# Patient Record
Sex: Female | Born: 1995 | Race: Black or African American | Hispanic: No | Marital: Single | Smoking: Current some day smoker
Health system: Southern US, Community
[De-identification: ages and names within clinical notes are randomized; demographics above are authoritative.]

## PROBLEM LIST (undated history)

## (undated) DIAGNOSIS — L309 Dermatitis, unspecified: Secondary | ICD-10-CM

## (undated) HISTORY — PX: VAGINA SURGERY: SHX829

## (undated) HISTORY — PX: WISDOM TOOTH EXTRACTION: SHX21

---
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
## 2017-07-11 ENCOUNTER — Emergency Department (HOSPITAL_COMMUNITY): Payer: Self-pay

## 2017-07-11 ENCOUNTER — Emergency Department (HOSPITAL_COMMUNITY)
Admission: EM | Admit: 2017-07-11 | Discharge: 2017-07-11 | Disposition: A | Discharge status: Home / Self Care | Payer: Self-pay | Attending: Emergency Medicine | Admitting: Emergency Medicine

## 2017-07-11 ENCOUNTER — Other Ambulatory Visit: Payer: Self-pay

## 2017-07-11 ENCOUNTER — Encounter (HOSPITAL_COMMUNITY): Payer: Self-pay | Admitting: Emergency Medicine

## 2017-07-11 DIAGNOSIS — Y999 Unspecified external cause status: Secondary | ICD-10-CM | POA: Insufficient documentation

## 2017-07-11 DIAGNOSIS — Z79899 Other long term (current) drug therapy: Secondary | ICD-10-CM | POA: Insufficient documentation

## 2017-07-11 DIAGNOSIS — Y9389 Activity, other specified: Secondary | ICD-10-CM | POA: Insufficient documentation

## 2017-07-11 DIAGNOSIS — S46911A Strain of unspecified muscle, fascia and tendon at shoulder and upper arm level, right arm, initial encounter: Secondary | ICD-10-CM | POA: Insufficient documentation

## 2017-07-11 DIAGNOSIS — F17200 Nicotine dependence, unspecified, uncomplicated: Secondary | ICD-10-CM | POA: Insufficient documentation

## 2017-07-11 DIAGNOSIS — Y929 Unspecified place or not applicable: Secondary | ICD-10-CM | POA: Insufficient documentation

## 2017-07-11 MED ORDER — METHYLPREDNISOLONE 4 MG PO TBPK: ORAL_TABLET | ORAL | 0 refills | Status: DC
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
## 2017-07-11 NOTE — Discharge Instructions (Signed)
Please read attached information regarding shoulder range of motion exercises. Take steroids and taper Dosepak as directed. Follow-up with orthopedist listed below for further evaluation and further imaging. Take Tylenol as needed for pain. Return to ED for worsening symptoms, additional injuries, numbness in arm, red hot/tender joint with fever.

## 2017-09-17 ENCOUNTER — Ambulatory Visit (HOSPITAL_COMMUNITY)
Admission: EM | Admit: 2017-09-17 | Discharge: 2017-09-17 | Disposition: A | Discharge status: Home / Self Care | Payer: Medicaid Other | Attending: Physician Assistant | Admitting: Physician Assistant

## 2017-09-17 ENCOUNTER — Encounter (HOSPITAL_COMMUNITY): Payer: Self-pay | Admitting: Emergency Medicine

## 2017-09-17 DIAGNOSIS — J00 Acute nasopharyngitis [common cold]: Secondary | ICD-10-CM

## 2017-09-17 MED ORDER — CETIRIZINE-PSEUDOEPHEDRINE ER 5-120 MG PO TB12: 1.0000 | ORAL_TABLET | Freq: Two times a day (BID) | ORAL | 0 refills | Status: AC

## 2017-09-17 MED ORDER — CETIRIZINE-PSEUDOEPHEDRINE ER 5-120 MG PO TB12: 1.0000 | ORAL_TABLET | Freq: Two times a day (BID) | ORAL | 0 refills | Status: DC

## 2017-09-17 MED ORDER — NAPROXEN 500 MG PO TABS: 500.0000 mg | ORAL_TABLET | Freq: Two times a day (BID) | ORAL | 0 refills | Status: DC

## 2017-09-17 NOTE — Discharge Instructions (Signed)
His symptoms are most likely allergies with a common cold.  We will try some Zyrtec-D and a pain reliever.  If not getting better in the next 3 days and please come back in.

## 2017-09-17 NOTE — ED Triage Notes (Addendum)
Started feeling bad one week ago.  Patient reports current menstrual is very light compared to usual, this is the normal time for her period.  Having back and abdominal pain   Both ears are hurting

## 2017-09-17 NOTE — ED Provider Notes (Signed)
09/17/2017 6:58 PM   DOB: 01/25/1996 / MRN: [id number]  SUBJECTIVE:  Kelli Hutchinson is a 22 y.o. female presenting for ear pressure, sinus pressure, mild rhinorrhea, itchy throat.  The symptoms started about 3-4 days ago.  She is here mostly because of her ear pressure states they feel full.  She denies fever, cough, dizziness.  As per HPI  She is allergic to pollen extract and bee pollen.   She  has no past medical history on file.    She  reports that she has been smoking.  she has never used smokeless tobacco. She reports that she uses drugs. Drug: Marijuana. She reports that she does not drink alcohol. She  reports that she currently engages in sexual activity. She reports using the following method of birth control/protection: Implant. The patient  has a past surgical history that includes Wisdom tooth extraction and Vagina surgery.  Her family history includes Hypertension in her mother.  ROS   As per HPI otherwise negative  OBJECTIVE:  BP 99/67 (BP Location: Left Arm)   Pulse 81   Temp 98.8 F (37.1 C) (Oral)   Resp 18   Wt 132 lb 6 oz (60 kg)   SpO2 100%   BMI 23.45 kg/m   Physical Exam  Constitutional: She is active.  Non-toxic appearance.  HENT:  Right Ear: Hearing, tympanic membrane, external ear and ear canal normal.  Left Ear: Hearing, tympanic membrane, external ear and ear canal normal.  Nose: Nose normal. Right sinus exhibits no maxillary sinus tenderness and no frontal sinus tenderness. Left sinus exhibits no maxillary sinus tenderness and no frontal sinus tenderness.  Mouth/Throat: Uvula is midline, oropharynx is clear and moist and mucous membranes are normal. Mucous membranes are not dry. No oropharyngeal exudate, posterior oropharyngeal edema or tonsillar abscesses.  Cardiovascular: Normal rate.  Pulmonary/Chest: Effort normal. No tachypnea.  Lymphadenopathy:       Head (right side): No submandibular and no tonsillar adenopathy present.       Head (left  side): No submandibular and no tonsillar adenopathy present.    She has no cervical adenopathy.  Neurological: She is alert.  Skin: Skin is warm and dry. She is not diaphoretic. No pallor.    No results found for this or any previous visit (from the past 72 hour(s)).  No results found.  ASSESSMENT AND PLAN:  No orders of the defined types were placed in this encounter.    Common cold patient with perfect exam.  Vitals excellent.  Symptoms are very mild.  We will treat symptomatic.  Connye Burkittlly advised this is going to take mostly time to make a full recovery.      The patient is advised to call or return to clinic if she does not see an improvement in symptoms, or to seek the care of the closest emergency department if she worsens with the above plan.   Deliah BostonMichael Roque Schill, MHS, PA-C 09/17/2017 6:58 PM    Ofilia Neaslark, Luz Burcher L, PA-C 09/17/17 1859

## 2018-04-10 ENCOUNTER — Emergency Department (HOSPITAL_COMMUNITY): Payer: Medicaid Other

## 2018-04-10 ENCOUNTER — Encounter (HOSPITAL_COMMUNITY): Payer: Self-pay | Admitting: Emergency Medicine

## 2018-04-10 ENCOUNTER — Emergency Department (HOSPITAL_COMMUNITY)
Admission: EM | Admit: 2018-04-10 | Discharge: 2018-04-10 | Disposition: A | Discharge status: Home / Self Care | Payer: Medicaid Other | Attending: Emergency Medicine | Admitting: Emergency Medicine

## 2018-04-10 DIAGNOSIS — F17210 Nicotine dependence, cigarettes, uncomplicated: Secondary | ICD-10-CM | POA: Insufficient documentation

## 2018-04-10 DIAGNOSIS — M25531 Pain in right wrist: Secondary | ICD-10-CM

## 2018-04-10 DIAGNOSIS — Z79899 Other long term (current) drug therapy: Secondary | ICD-10-CM | POA: Insufficient documentation

## 2018-04-10 MED ORDER — IBUPROFEN 400 MG PO TABS
600.0000 mg | ORAL_TABLET | Freq: Once | ORAL | Status: AC
  Administered 2018-04-10: 12:00:00 600 mg via ORAL
  Filled 2018-04-10: qty 1

## 2018-04-10 NOTE — Discharge Instructions (Signed)
Your x-ray shows no evidence of fracture, you likely have a wrist sprain or mild soft tissue injury.  Please use wrist brace for support.  Ice and elevate the wrist.  Tylenol and Motrin as needed for pain every 6 hours.  If pain is still not improving please follow-up with your primary care doctor.  Return for significantly worsened wrist pain or swelling, fevers, numbness or weakness over the wrist that persists or any other new or concerning symptoms.

## 2018-04-10 NOTE — ED Notes (Signed)
Declined W/C at D/C and was escorted to lobby by RN. 

## 2018-04-10 NOTE — ED Triage Notes (Signed)
Patient to ED c/o R wrist pain and swelling after throwing the football around yesterday. Reports painful to move. No obvious injury noted.

## 2018-04-10 NOTE — ED Provider Notes (Signed)
[facility] EMERGENCY DEPARTMENT Provider Note   CSN: [id number] Arrival date & time: 04/10/18  1101     History   Chief Complaint Chief Complaint  Patient presents with  . Wrist Pain    HPI Kelli Hutchinson is a 22 y.o. female.  Kelli Hutchinson is a 22 y.o. Female was healthy, presents to the ED for evaluation of right wrist pain.  Patient reports yesterday she was playing football with family members, went to catch the football but it hit her in the ulnar aspect of the wrist and she felt a slight pop.  Since then she has had pain and swelling over the ulnar aspect of the wrist that radiates up into the hand.  She took some Tylenol last night and this morning with minimal improvement, has not tried anything else to treat pain, but reports pain is constant and worsening.  She denies any numbness or weakness had some tingling initially after the injury that has resolved.  She has full range of motion of all fingers and wrist with some discomfort.  No prior injury or surgery to the wrist.  No lacerations or ecchymosis.     History reviewed. No pertinent past medical history.  There are no active problems to display for this patient.   Past Surgical History:  Procedure Laterality Date  . VAGINA SURGERY    . WISDOM TOOTH EXTRACTION       OB History   None      Home Medications    Prior to Admission medications   Medication Sig Start Date End Date Taking? Authorizing Provider  naproxen (NAPROSYN) 500 MG tablet Take 1 tablet (500 mg total) by mouth 2 (two) times daily. 09/17/17   Ofilia Neas, PA-C    Family History Family History  Problem Relation Age of Onset  . Hypertension Mother     Social History Social History   Tobacco Use  . Smoking status: Current Some Day Smoker  . Smokeless tobacco: Never Used  Substance Use Topics  . Alcohol use: No  . Drug use: Yes    Types: Marijuana     Allergies   Pollen extract and Bee  pollen   Review of Systems Review of Systems  Constitutional: Negative for chills and fever.  Musculoskeletal: Positive for arthralgias and joint swelling.  Skin: Negative for color change, rash and wound.  Neurological: Negative for weakness and numbness.     Physical Exam Updated Vital Signs BP 99/73 (BP Location: Left Arm)   Pulse 90   Temp 99 F (37.2 C) (Oral)   Resp 18   LMP 04/10/2018 (Exact Date)   SpO2 100%   Physical Exam  Constitutional: She appears well-developed and well-nourished. No distress.  HENT:  Head: Normocephalic and atraumatic.  Eyes: Right eye exhibits no discharge. Left eye exhibits no discharge.  Pulmonary/Chest: Effort normal. No respiratory distress.  Musculoskeletal:  Tenderness to palpation over the ulnar aspect of the right wrist with a small amount of swelling noted, no erythema or warmth and no obvious palpable deformity.  Range of motion of the wrist is intact with some discomfort, normal range of motion of all fingers, cardinal hand movements intact, 5/5 grip strength.  2+ radial and ulnar pulse and good capillary refill, sensation intact throughout.  All compartments soft.  Neurological: She is alert. Coordination normal.  Skin: Skin is warm and dry. She is not diaphoretic.  Psychiatric: She has a normal mood and affect. Her behavior is  normal.  Nursing note and vitals reviewed.    ED Treatments / Results  Labs (all labs ordered are listed, but only abnormal results are displayed) Labs Reviewed - No data to display  EKG None  Radiology Dg Wrist Complete Right  Result Date: 04/10/2018 CLINICAL DATA:  Pt said she was catching a football yesterday when it hit her wrist and "shifted" her bone. Pain and swelling to ulnar side of right wrist. No surgery to wrist. EXAM: RIGHT WRIST - COMPLETE 3+ VIEW COMPARISON:  None. FINDINGS: There is no evidence of fracture or dislocation. There is no evidence of arthropathy or other focal bone  abnormality. Soft tissues are unremarkable. IMPRESSION: Negative. Electronically Signed   By: Amie Portland M.D.   On: 04/10/2018 12:13    Procedures Procedures (including critical care time)  Medications Ordered in ED Medications  ibuprofen (ADVIL,MOTRIN) tablet 600 mg (600 mg Oral Given 04/10/18 1229)     Initial Impression / Assessment and Plan / ED Course  I have reviewed the triage vital signs and the nursing notes.  Pertinent labs & imaging results that were available during my care of the patient were reviewed by me and considered in my medical decision making (see chart for details).  Patient presents for evaluation of right wrist pain after she was hit in the wrist with a football while playing with family members.  Small amount of swelling but no palpable deformity on exam.  Hand is neurovascularly intact with range of motion intact.  X-ray shows no evidence of fracture dislocation.  Suspect slight sprain or soft tissue injury.  Patient placed in Velcro wrist splint.  Encouraged Tylenol and NSAIDs, ice and elevation.  Patient to follow-up with primary care doctor if symptoms are not improving.  Return precautions discussed.  Patient expresses understanding and agreement with plan.  Stable for discharge at this time.  Final Clinical Impressions(s) / ED Diagnoses   Final diagnoses:  Right wrist pain    ED Discharge Orders    None       Legrand Rams 04/10/18 1251    Loren Racer, MD 04/11/18 [phone]

## 2018-09-05 ENCOUNTER — Ambulatory Visit (HOSPITAL_COMMUNITY)
Admission: EM | Admit: 2018-09-05 | Discharge: 2018-09-05 | Disposition: A | Discharge status: Home / Self Care | Payer: Self-pay | Attending: Family Medicine | Admitting: Family Medicine

## 2018-09-05 ENCOUNTER — Encounter (HOSPITAL_COMMUNITY): Payer: Self-pay | Admitting: Emergency Medicine

## 2018-09-05 ENCOUNTER — Ambulatory Visit (INDEPENDENT_AMBULATORY_CARE_PROVIDER_SITE_OTHER): Payer: Self-pay

## 2018-09-05 DIAGNOSIS — M79644 Pain in right finger(s): Secondary | ICD-10-CM

## 2018-09-05 DIAGNOSIS — R1110 Vomiting, unspecified: Secondary | ICD-10-CM

## 2018-09-05 NOTE — ED Triage Notes (Signed)
Pt states she threw up yesterday a whole lot, denies throwing up today, states her job required her to come get checked out. Pt states they wont let her come back to work without a note.

## 2018-09-05 NOTE — Discharge Instructions (Signed)
Thumb will take 3-6 weeks to recover  Vomiting seems to be better.  Return as needed

## 2018-09-05 NOTE — ED Triage Notes (Signed)
Pt also c/o thumb pain in her R hand states two weeks ago she bent her thumb backward.

## 2018-09-05 NOTE — ED Provider Notes (Signed)
[facility]    CSN: [id number] Arrival date & time: 09/05/18  1220     History   Chief Complaint Chief Complaint  Patient presents with  . Emesis  . Letter for School/Work  . Thumb Pain    HPI Kelli Hutchinson is a 23 y.o. female.   HPI  Patient is here for 2 problems.  She states yesterday she woke up with some nausea.  She then proceeded to vomit several times.  Was in bed through most of the morning early afternoon.  She states that she also started her period.  She was having cramps.  Sometimes she does have a bed.  With nausea and vomiting as well as the cramping.  She states she took something for the nausea, then took some Aleve, when she "woke up this morning she felt well. She needs a note for work for missing yesterday. She also has right thumb pain.  She fell about 2 weeks ago.  Her thumb was quite swollen and painful.  She thought it would improve on its own.  It is mostly better, but still has swelling and pain around the DIP joint.  She states she cannot pinch with any force without feeling pain in her thumb.  History reviewed. No pertinent past medical history.  There are no active problems to display for this patient.   Past Surgical History:  Procedure Laterality Date  . VAGINA SURGERY    . WISDOM TOOTH EXTRACTION      OB History   No obstetric history on file.      Home Medications    Prior to Admission medications   Not on File    Family History Family History  Problem Relation Age of Onset  . Hypertension Mother     Social History Social History   Tobacco Use  . Smoking status: Current Some Day Smoker  . Smokeless tobacco: Never Used  Substance Use Topics  . Alcohol use: No  . Drug use: Yes    Types: Marijuana     Allergies   Pollen extract and Bee pollen   Review of Systems Review of Systems  Constitutional: Negative for chills and fever.  HENT: Negative for ear pain and sore throat.   Eyes: Negative for pain  and visual disturbance.  Respiratory: Negative for cough and shortness of breath.   Cardiovascular: Negative for chest pain and palpitations.  Gastrointestinal: Negative.  Negative for abdominal pain, nausea and vomiting.       Vomiting is resolved  Genitourinary: Positive for menstrual problem. Negative for dysuria and hematuria.  Musculoskeletal: Positive for arthralgias. Negative for back pain.  Skin: Negative for color change and rash.  Neurological: Negative for seizures and syncope.  All other systems reviewed and are negative.    Physical Exam Triage Vital Signs ED Triage Vitals  Enc Vitals Group     BP 09/05/18 1245 111/62     Pulse Rate 09/05/18 1245 (!) 58     Resp 09/05/18 1245 14     Temp 09/05/18 1245 98 F (36.7 C)     Temp src --      SpO2 09/05/18 1245 100 %     Weight --      Height --      Head Circumference --      Peak Flow --      Pain Score 09/05/18 1247 0     Pain Loc --      Pain Edu? --  Excl. in GC? --    No data found.  Updated Vital Signs BP 111/62   Pulse (!) 58   Temp 98 F (36.7 C)   Resp 14   LMP 09/05/2018   SpO2 100%      Physical Exam Constitutional:      General: She is not in acute distress.    Appearance: She is well-developed.  HENT:     Head: Normocephalic and atraumatic.     Nose: Nose normal.     Mouth/Throat:     Mouth: Mucous membranes are moist.  Eyes:     Conjunctiva/sclera: Conjunctivae normal.     Pupils: Pupils are equal, round, and reactive to light.  Neck:     Musculoskeletal: Normal range of motion.  Cardiovascular:     Rate and Rhythm: Normal rate and regular rhythm.     Heart sounds: Normal heart sounds.  Pulmonary:     Effort: Pulmonary effort is normal. No respiratory distress.     Breath sounds: Normal breath sounds.  Abdominal:     General: There is no distension.     Palpations: Abdomen is soft.     Tenderness: There is no abdominal tenderness.  Musculoskeletal: Normal range of motion.       Comments: The right thumb has tenderness around the DIP joint.  Full range of motion.  No instability.  It is slightly swollen as opposed to the left  Skin:    General: Skin is warm and dry.  Neurological:     General: No focal deficit present.     Mental Status: She is alert. Mental status is at baseline.  Psychiatric:        Mood and Affect: Mood normal.      UC Treatments / Results  Labs (all labs ordered are listed, but only abnormal results are displayed) Labs Reviewed - No data to display  EKG None  Radiology Dg Finger Thumb Right  Result Date: 09/05/2018 CLINICAL DATA:  Right thumb pain after fall 2 weeks ago. EXAM: RIGHT THUMB 2+V COMPARISON:  Radiographs of April 10, 2018. FINDINGS: There is no evidence of fracture or dislocation. There is no evidence of arthropathy or other focal bone abnormality. Soft tissues are unremarkable IMPRESSION: Negative. Electronically Signed   By: Lupita Raider, M.D.   On: 09/05/2018 13:26    Procedures Procedures (including critical care time)  Medications Ordered in UC Medications - No data to display  Initial Impression / Assessment and Plan / UC Course  I have reviewed the triage vital signs and the nursing notes.  Pertinent labs & imaging results that were available during my care of the patient were reviewed by me and considered in my medical decision making (see chart for details).      Final Clinical Impressions(s) / UC Diagnoses   Final diagnoses:  Non-intractable vomiting, presence of nausea not specified, unspecified vomiting type  Thumb pain, right     Discharge Instructions     Thumb will take 3-6 weeks to recover  Vomiting seems to be better.  Return as needed   ED Prescriptions    None     Controlled Substance Prescriptions [place] Controlled Substance Registry consulted? Not Applicable   Eustace Moore, MD 09/05/18 1348

## 2019-04-19 IMAGING — CR DG CHEST 2V
2 series · 2 of 2 positions shown · non-contrast
Comparison: 08/12/2016.

CLINICAL DATA: Right chest pain.  Smoker.

EXAM:
CHEST  2 VIEW

[w chest pa]
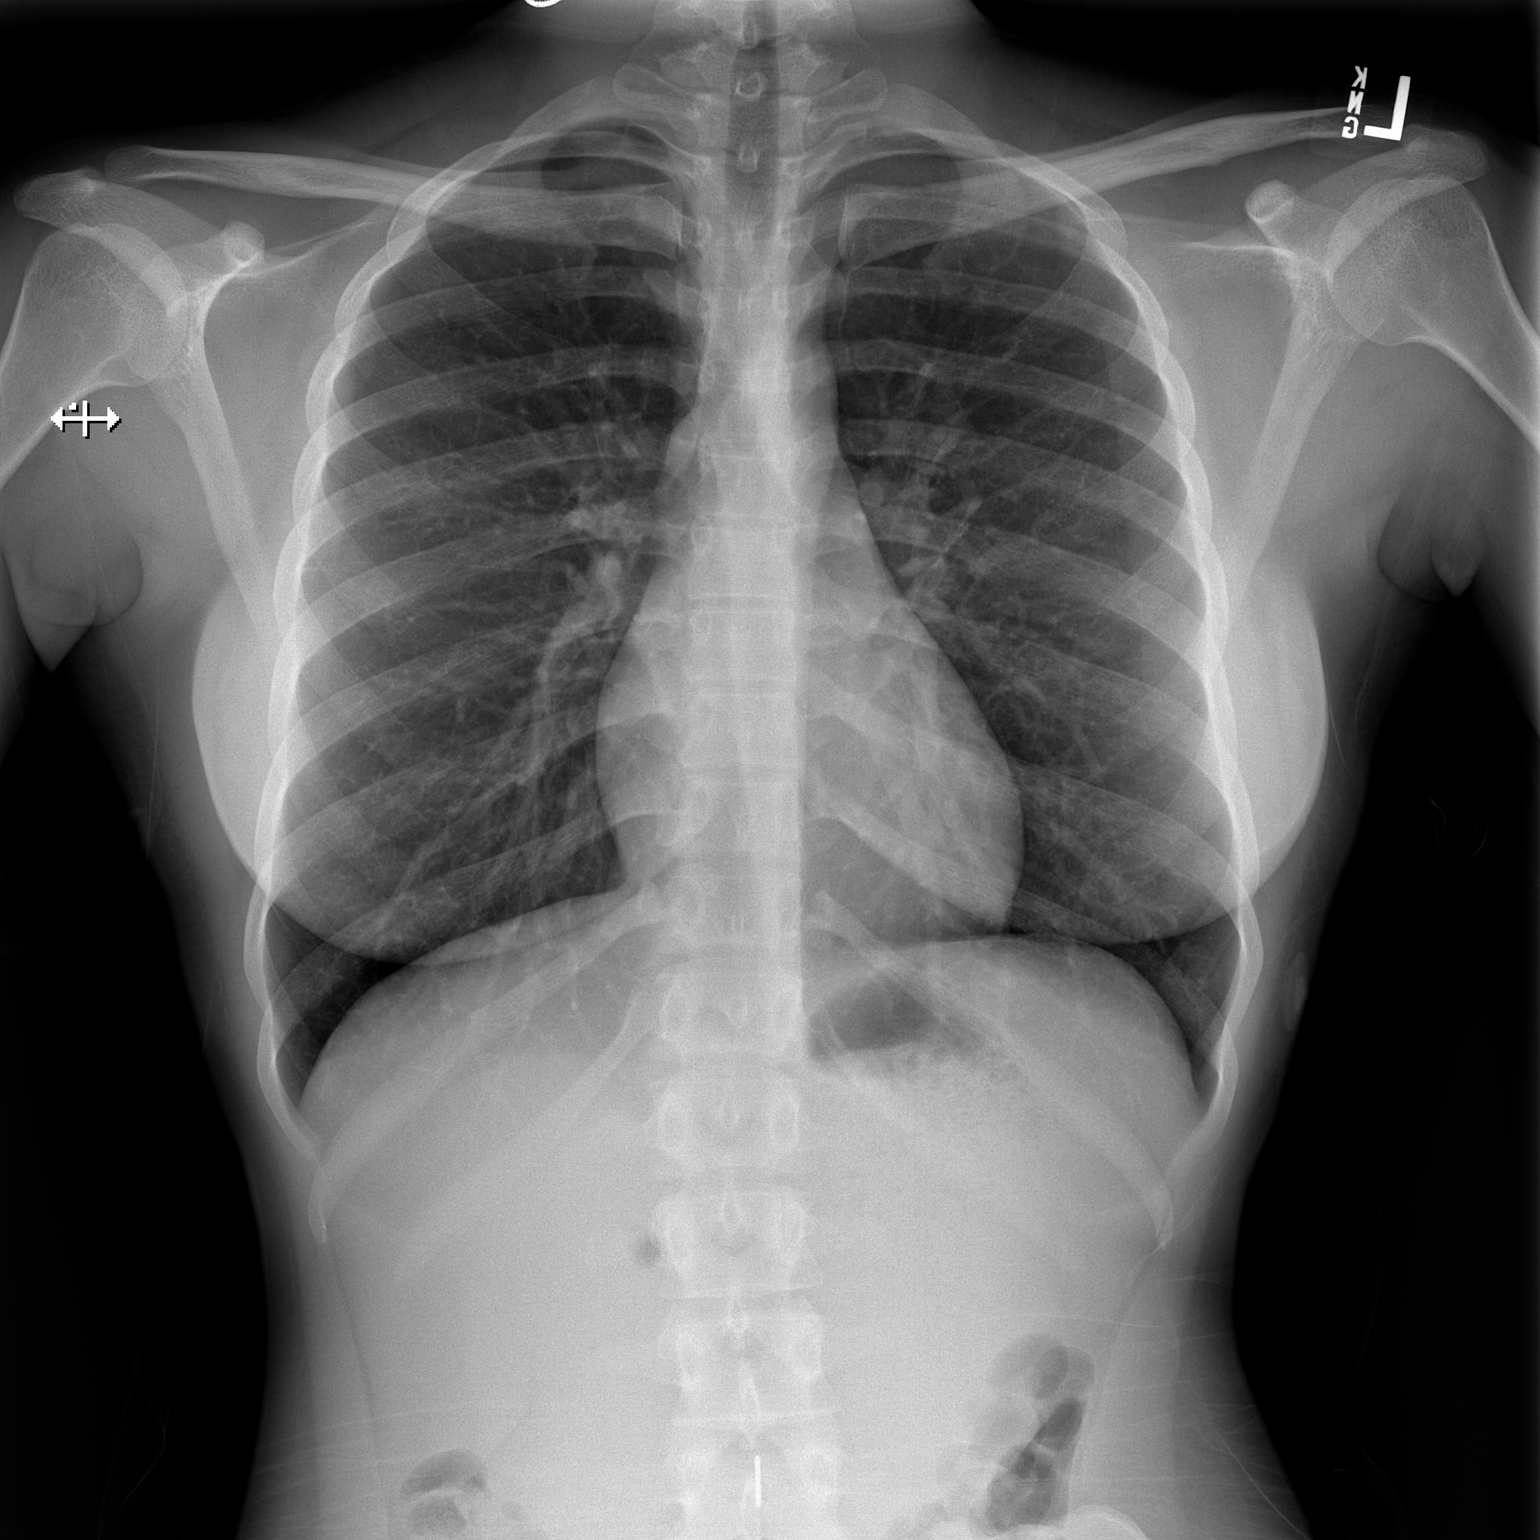

[w chest lat]
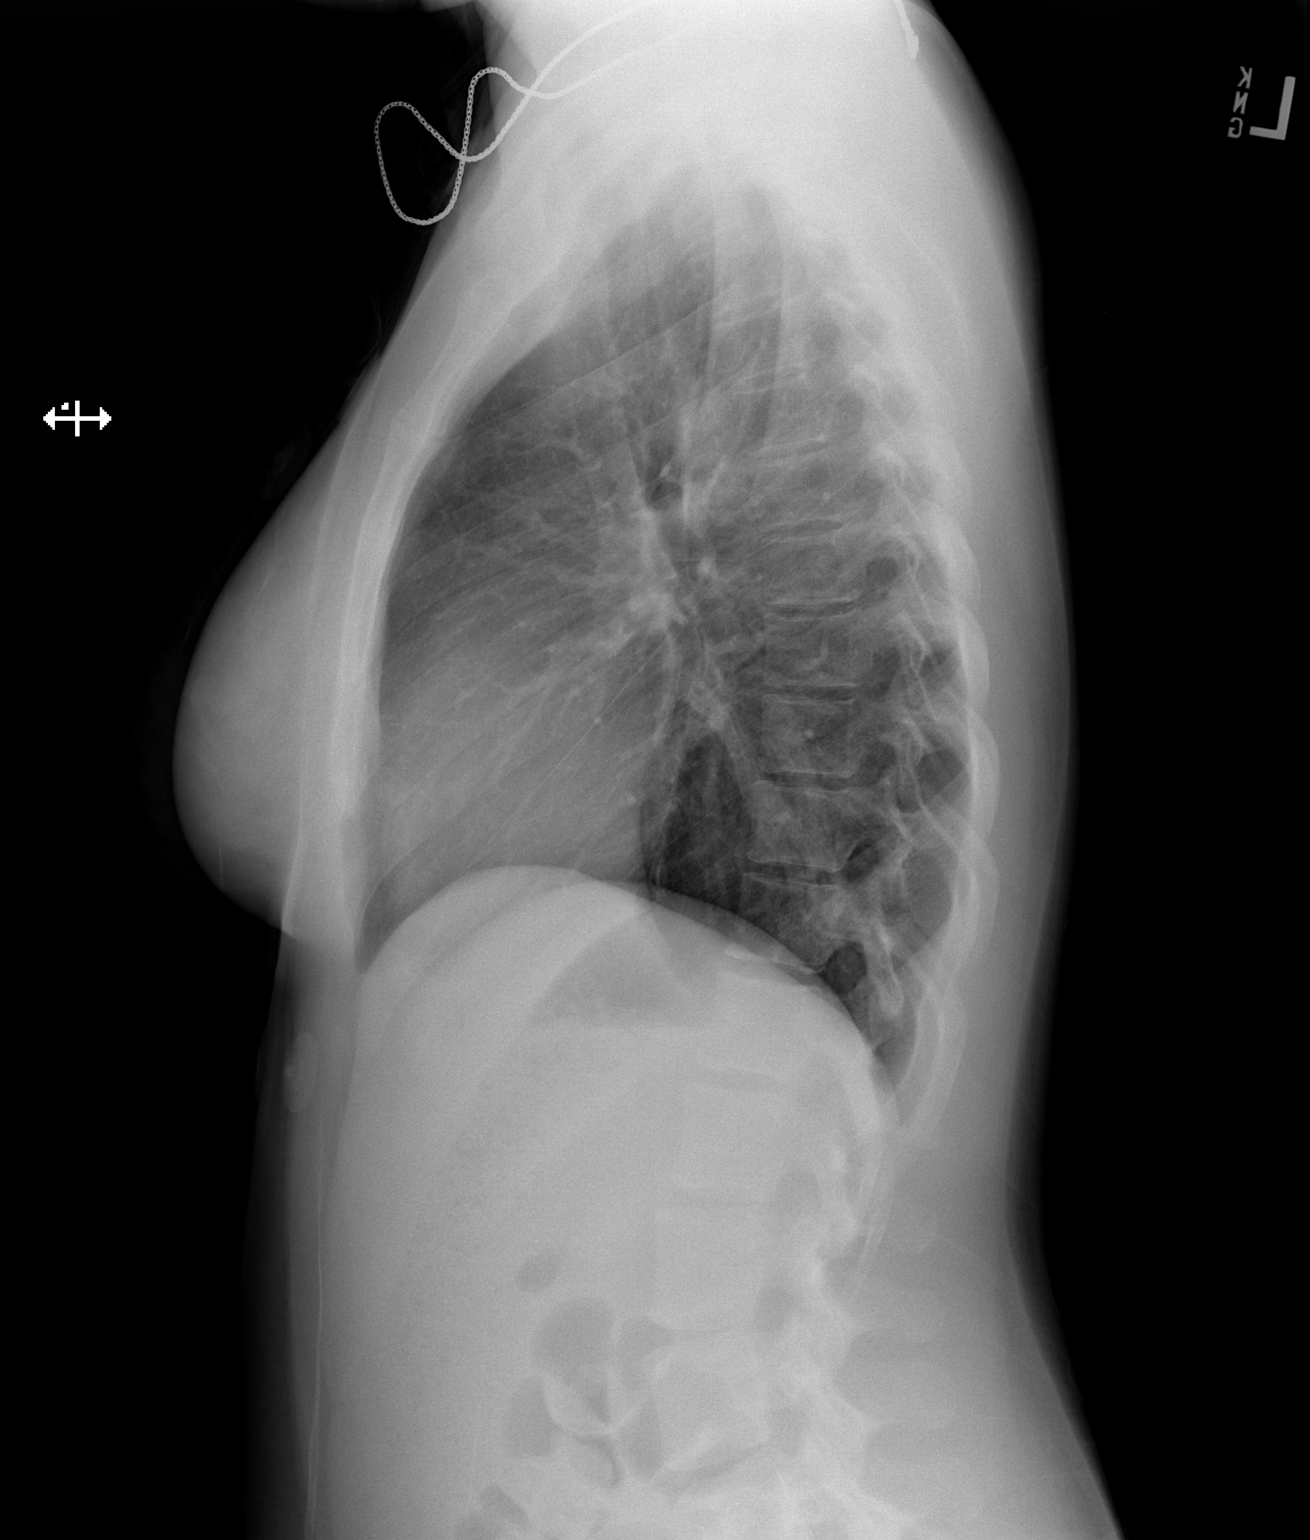

[2 of 2 positions shown; findings below may reference images not displayed]

FINDINGS: The heart size and mediastinal contours are within normal limits.
Both lungs are clear. The visualized skeletal structures are
unremarkable.
IMPRESSION: Normal examination.

## 2020-09-01 IMAGING — CR DG WRIST COMPLETE 3+V*R*
4 series · 4 of 4 positions shown · non-contrast
Comparison: None.

CLINICAL DATA: Pt said she was catching a football yesterday when
it hit her wrist and "shifted" her bone. Pain and swelling to ulnar
side of right wrist. No surgery to wrist.

EXAM:
RIGHT WRIST - COMPLETE 3+ VIEW

[wrist pa]
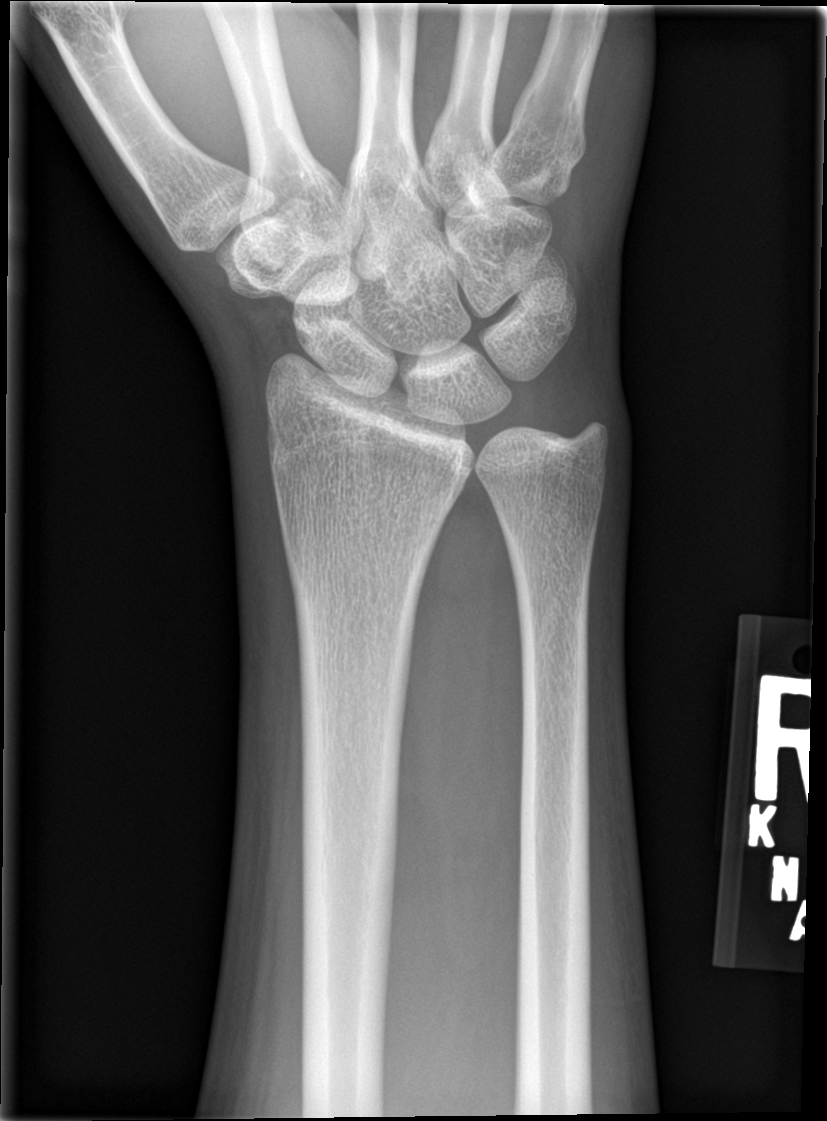

[wrist obl]
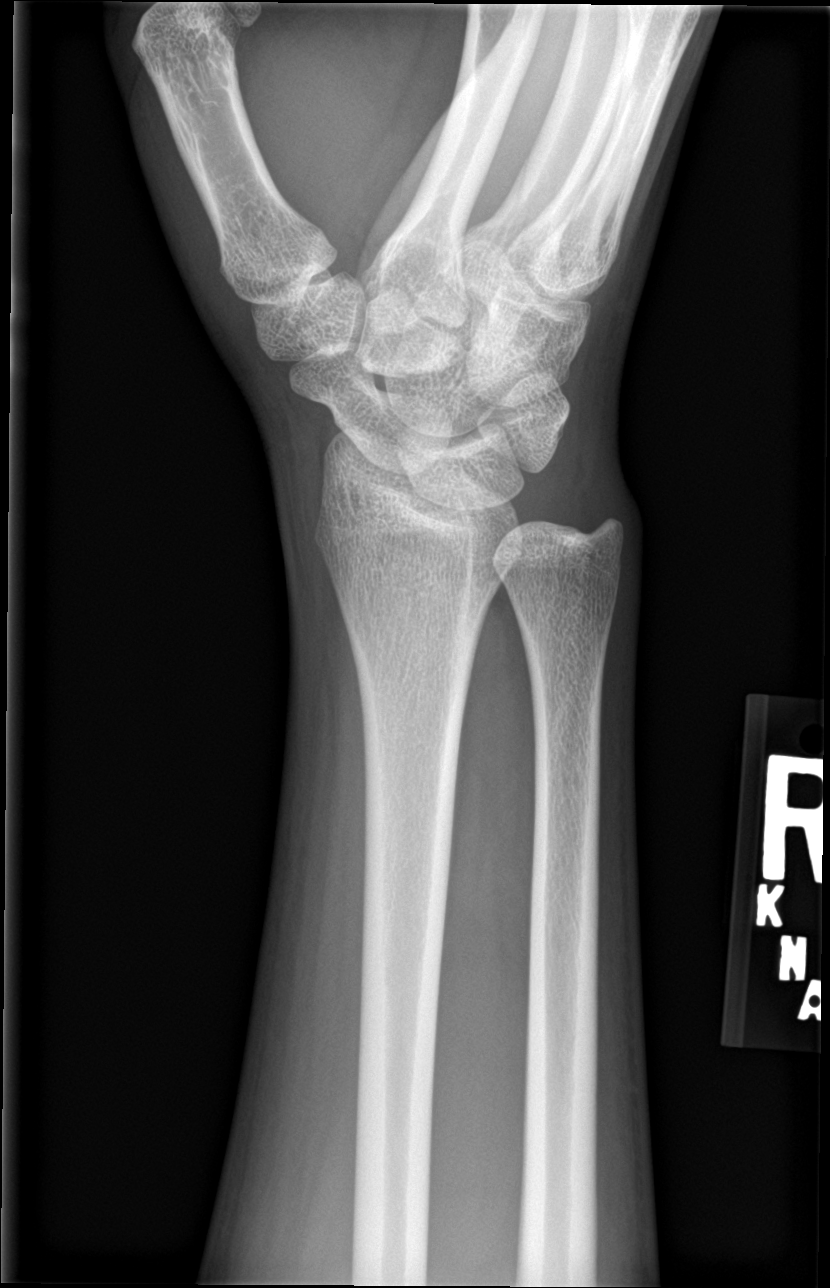

[wrist lat]
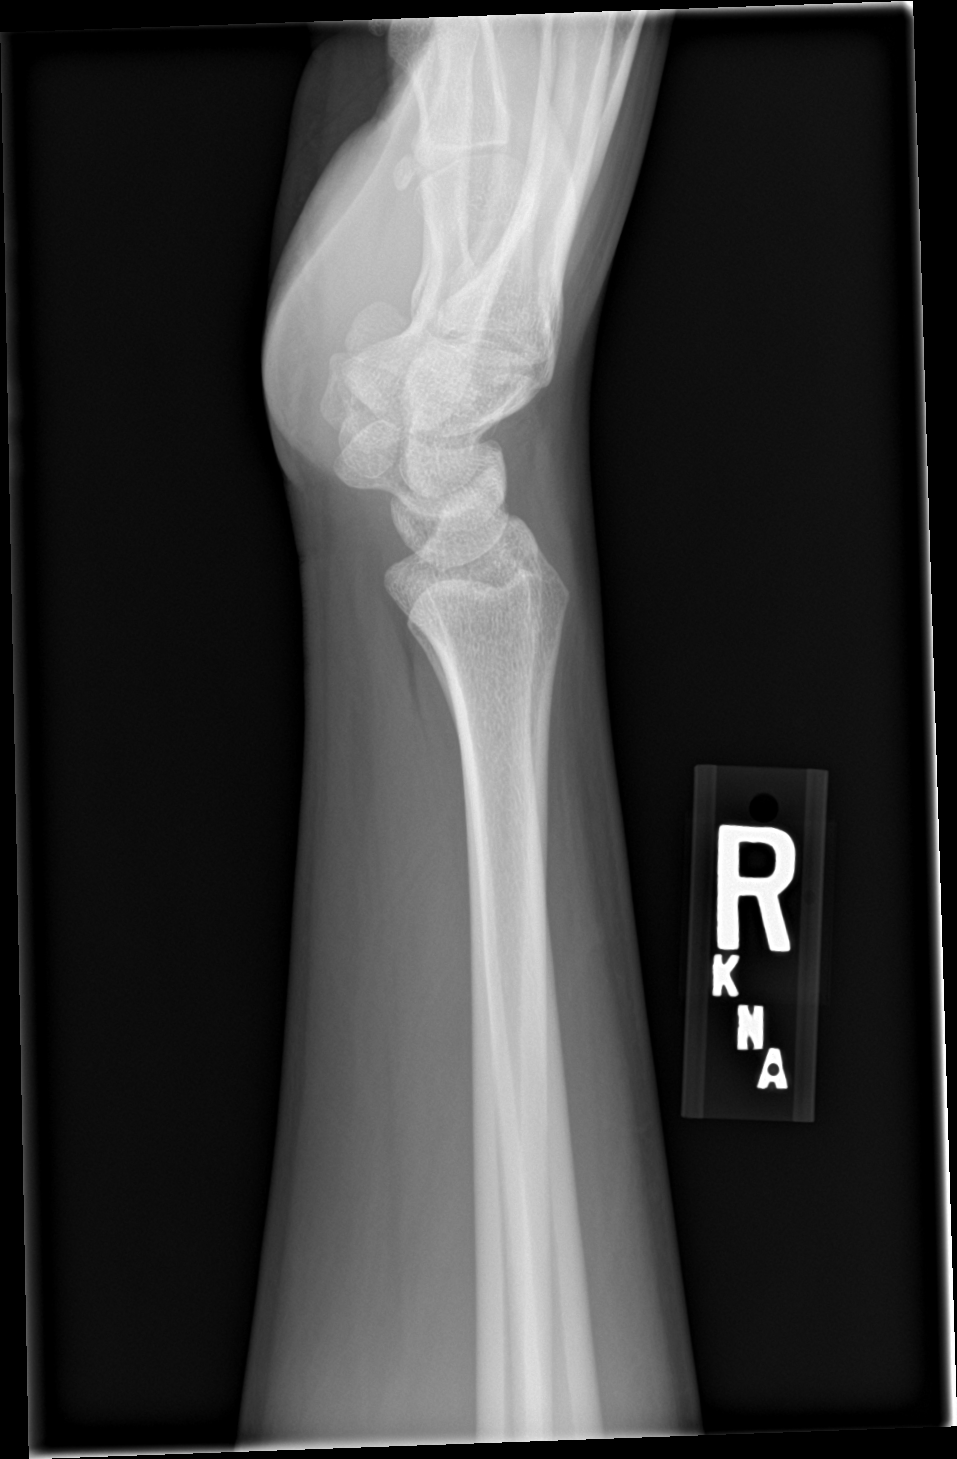

[wrist navicular]
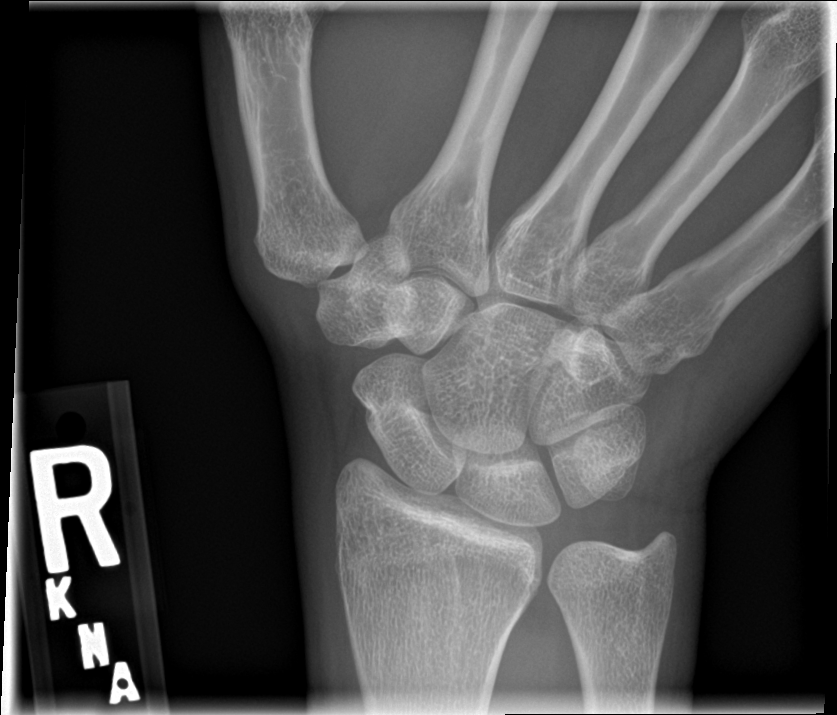

[4 of 4 positions shown; findings below may reference images not displayed]

FINDINGS: There is no evidence of fracture or dislocation. There is no
evidence of arthropathy or other focal bone abnormality. Soft
tissues are unremarkable.
IMPRESSION: Negative.

## 2024-05-19 ENCOUNTER — Encounter (HOSPITAL_BASED_OUTPATIENT_CLINIC_OR_DEPARTMENT_OTHER): Payer: Self-pay | Admitting: Emergency Medicine

## 2024-05-19 ENCOUNTER — Ambulatory Visit (HOSPITAL_BASED_OUTPATIENT_CLINIC_OR_DEPARTMENT_OTHER)
Admission: EM | Admit: 2024-05-19 | Discharge: 2024-05-19 | Disposition: A | Discharge status: Home / Self Care | Payer: Self-pay | Attending: Family Medicine | Admitting: Family Medicine

## 2024-05-19 DIAGNOSIS — B9789 Other viral agents as the cause of diseases classified elsewhere: Secondary | ICD-10-CM | POA: Insufficient documentation

## 2024-05-19 DIAGNOSIS — Z720 Tobacco use: Secondary | ICD-10-CM | POA: Insufficient documentation

## 2024-05-19 DIAGNOSIS — J069 Acute upper respiratory infection, unspecified: Secondary | ICD-10-CM

## 2024-05-19 DIAGNOSIS — J029 Acute pharyngitis, unspecified: Secondary | ICD-10-CM

## 2024-05-19 DIAGNOSIS — R509 Fever, unspecified: Secondary | ICD-10-CM

## 2024-05-19 DIAGNOSIS — R051 Acute cough: Secondary | ICD-10-CM

## 2024-05-19 HISTORY — DX: Dermatitis, unspecified: L30.9

## 2024-05-19 LAB — POCT RAPID STREP A (OFFICE): Rapid Strep A Screen: NEGATIVE

## 2024-05-19 LAB — POC SOFIA SARS ANTIGEN FIA: SARS Coronavirus 2 Ag: NEGATIVE

## 2024-05-19 MED ORDER — PROMETHAZINE-DM 6.25-15 MG/5ML PO SYRP: 5.0000 mL | ORAL_SOLUTION | Freq: Four times a day (QID) | ORAL | 0 refills | Status: AC | PRN

## 2024-05-19 MED ORDER — AMOXICILLIN 875 MG PO TABS: 875.0000 mg | ORAL_TABLET | Freq: Two times a day (BID) | ORAL | 0 refills | Status: DC

## 2024-05-19 MED ORDER — CEFDINIR 300 MG PO CAPS: 300.0000 mg | ORAL_CAPSULE | Freq: Two times a day (BID) | ORAL | 0 refills | Status: AC

## 2024-05-19 NOTE — ED Triage Notes (Signed)
 Today woke up with sore throat and hoarse voice. Did virtual doctor appt and temperature of 99. Today to be seen since throat looked very irritated. Reports that this morning to this afternoon had bloody phlegm that was a gritty texture.

## 2024-05-19 NOTE — ED Provider Notes (Addendum)
 [facility]    CSN: [id number] Arrival date & time: 05/19/24  1801      History   Chief Complaint Chief Complaint  Patient presents with   Sore Throat   Hoarse    HPI Kelli Hutchinson is a 28 y.o. female.   28 year old female who reports that she woke up with a sore throat and a hoarse voice this morning.  She tried a virtual doctor's appointment but they were encouraged her to be seen.  She had a temperature of 99.0.  She has coughed up yellow-white phlegm with a gritty texture and some streaks of blood in it during the day today.  She has a cough and some sinus pressure.  Patient reports that she had teeth pulled and was on amoxicillin from 05/08/24-05/18/24.  She has been off the amoxicillin approximately 24 hours   Sore Throat Pertinent negatives include no chest pain, no abdominal pain and no shortness of breath.    Past Medical History:  Diagnosis Date   Eczema     There are no active problems to display for this patient.   Past Surgical History:  Procedure Laterality Date   VAGINA SURGERY     WISDOM TOOTH EXTRACTION      OB History   No obstetric history on file.      Home Medications    Prior to Admission medications   Medication Sig Start Date End Date Taking? Authorizing Provider  cefdinir (OMNICEF) 300 MG capsule Take 1 capsule (300 mg total) by mouth 2 (two) times daily for 7 days. 05/19/24 05/26/24 Yes Ival Domino, FNP  promethazine-dextromethorphan (PROMETHAZINE-DM) 6.25-15 MG/5ML syrup Take 5 mLs by mouth 4 (four) times daily as needed for cough. Do not use and drive - May make drowsy. 05/19/24  Yes Ival Domino, FNP  SUMAtriptan (IMITREX) 25 MG tablet Take 25 mg by mouth. 04/22/24  Yes [provider]    Family History Family History  Problem Relation Age of Onset   Hypertension Mother     Social History Social History   Tobacco Use   Smoking status: Some Days   Smokeless tobacco: Never  Substance Use Topics    Alcohol use: No   Drug use: Yes    Types: Marijuana     Allergies   Pollen extract and Bee pollen   Review of Systems Review of Systems  Constitutional:  Positive for fever. Negative for chills.  HENT:  Positive for congestion, postnasal drip, rhinorrhea, sinus pressure, sinus pain and sore throat. Negative for ear pain.   Eyes:  Negative for pain and visual disturbance.  Respiratory:  Positive for cough. Negative for shortness of breath.   Cardiovascular:  Negative for chest pain and palpitations.  Gastrointestinal:  Negative for abdominal pain, constipation, diarrhea, nausea and vomiting.  Genitourinary:  Negative for dysuria and hematuria.  Musculoskeletal:  Negative for arthralgias and back pain.  Skin:  Negative for color change and rash.  Neurological:  Negative for seizures and syncope.  All other systems reviewed and are negative.    Physical Exam Triage Vital Signs ED Triage Vitals  Encounter Vitals Group     BP 05/19/24 1913 110/75     Girls Systolic BP Percentile --      Girls Diastolic BP Percentile --      Boys Systolic BP Percentile --      Boys Diastolic BP Percentile --      Pulse Rate 05/19/24 1913 100     Resp 05/19/24  1913 17     Temp 05/19/24 1913 98.5 F (36.9 C)     Temp Source 05/19/24 1913 Oral     SpO2 05/19/24 1913 96 %     Weight --      Height --      Head Circumference --      Peak Flow --      Pain Score 05/19/24 1854 8     Pain Loc --      Pain Education --      Exclude from Growth Chart --    No data found.  Updated Vital Signs BP 110/75 (BP Location: Right Arm)   Pulse 100   Temp 98.5 F (36.9 C) (Oral)   Resp 17   SpO2 96%   Visual Acuity Right Eye Distance:   Left Eye Distance:   Bilateral Distance:    Right Eye Near:   Left Eye Near:    Bilateral Near:     Physical Exam Vitals and nursing note reviewed.  Constitutional:      General: She is not in acute distress.    Appearance: She is well-developed. She is  not ill-appearing, toxic-appearing or diaphoretic.  HENT:     Head: Normocephalic and atraumatic.     Right Ear: Hearing, tympanic membrane, ear canal and external ear normal.     Left Ear: Hearing, tympanic membrane, ear canal and external ear normal.     Nose: Congestion and rhinorrhea present. Rhinorrhea is clear.     Right Sinus: Maxillary sinus tenderness present. No frontal sinus tenderness.     Left Sinus: Maxillary sinus tenderness present. No frontal sinus tenderness.     Mouth/Throat:     Lips: Pink.     Mouth: Mucous membranes are moist.     Pharynx: Uvula midline. Posterior oropharyngeal erythema present. No oropharyngeal exudate.     Tonsils: No tonsillar exudate (Erythema and enlargement of tonsils but no exudate).  Eyes:     Conjunctiva/sclera: Conjunctivae normal.     Pupils: Pupils are equal, round, and reactive to light.  Cardiovascular:     Rate and Rhythm: Normal rate and regular rhythm.     Heart sounds: S1 normal and S2 normal. No murmur heard. Pulmonary:     Effort: Pulmonary effort is normal. No respiratory distress.     Breath sounds: Normal breath sounds. No decreased breath sounds, wheezing, rhonchi or rales.  Abdominal:     General: Bowel sounds are normal.     Palpations: Abdomen is soft.     Tenderness: There is no abdominal tenderness.  Musculoskeletal:        General: No swelling.     Cervical back: Neck supple.  Lymphadenopathy:     Head:     Right side of head: No submental, submandibular, tonsillar, preauricular or posterior auricular adenopathy.     Left side of head: No submental, submandibular, tonsillar, preauricular or posterior auricular adenopathy.     Cervical: Cervical adenopathy present.     Right cervical: Superficial cervical adenopathy present.     Left cervical: Superficial cervical adenopathy present.  Skin:    General: Skin is warm and dry.     Capillary Refill: Capillary refill takes less than 2 seconds.     Findings: No rash.   Neurological:     Mental Status: She is alert and oriented to person, place, and time.  Psychiatric:        Mood and Affect: Mood normal.  UC Treatments / Results  Labs (all labs ordered are listed, but only abnormal results are displayed) Labs Reviewed  POCT RAPID STREP A (OFFICE) - Normal  POC SOFIA SARS ANTIGEN FIA - Normal  CULTURE, GROUP A STREP [facility])    EKG   Radiology No results found.  Procedures Procedures (including critical care time)  Medications Ordered in UC Medications - No data to display  Initial Impression / Assessment and Plan / UC Course  I have reviewed the triage vital signs and the nursing notes.  Pertinent labs & imaging results that were available during my care of the patient were reviewed by me and considered in my medical decision making (see chart for details).  Plan of Care: Viral upper respiratory infection with fever, cough and sore throat: Rapid strep is negative.  Rapid COVID was negative.  Throat culture sent.  Based on exam findings will treat with cefdinir, 300 mg, 1 pill twice daily for 7 days.  Will adjust the plan of care, if needed once the culture results.  If the culture is negative we will call her and let her know to stop the Cefdinir.  Originally prescribed amoxicillin but the patient let me know that she was on amoxicillin earlier in October and completed it on 05/18/2024.  So cefdinir was prescribed instead.  Promethazine DM, 5 mL, every 6 hours if needed for cough.  Get plenty of fluids and rest.    Patient could have COVID and it might be too early to test.  Encouraged to consider testing for COVID again tomorrow afternoon.  Work excuse provided.  Follow-up if symptoms do not improve, worsen or new symptoms occur.  I reviewed the plan of care with the patient and/or the patient's guardian.  The patient and/or guardian had time to ask questions and acknowledged that the questions were answered.  I provided instruction on  symptoms or reasons to return here or to go to an ER, if symptoms/condition did not improve, worsened or if new symptoms occurred.  Final Clinical Impressions(s) / UC Diagnoses   Final diagnoses:  Fever, unspecified  Sore throat  Acute cough  Viral URI with cough     Discharge Instructions      Viral upper respiratory infection with fever, cough and sore throat: Rapid strep is negative.  Rapid COVID was negative.  Throat culture sent.  Based on exam findings will treat with amoxicillin, 875 mg, 1 pill twice daily for 7 days.  Will adjust the plan of care, if needed once the culture results.  If the culture is negative we will call her and let her know to stop the amoxicillin.  Promethazine DM, 5 mL, every 6 hours if needed for cough.  Get plenty of fluids and rest.    Patient could have COVID and it might be too early to test.  Encouraged to consider testing for COVID again tomorrow afternoon.  Work excuse provided.  Follow-up if symptoms do not improve, worsen or new symptoms occur.     ED Prescriptions     Medication Sig Dispense Auth. Provider   promethazine-dextromethorphan (PROMETHAZINE-DM) 6.25-15 MG/5ML syrup Take 5 mLs by mouth 4 (four) times daily as needed for cough. Do not use and drive - May make drowsy. 118 mL Ival Domino, FNP   amoxicillin (AMOXIL) 875 MG tablet  (Status: Discontinued) Take 1 tablet (875 mg total) by mouth 2 (two) times daily for 7 days. 14 tablet Nasirah Sachs, FNP   cefdinir (OMNICEF) 300 MG capsule  Take 1 capsule (300 mg total) by mouth 2 (two) times daily for 7 days. 14 capsule Ival Domino, FNP      PDMP not reviewed this encounter.   Ival Domino, FNP 05/19/24 2041    Ival Domino, FNP 05/19/24 2051    Ival Domino, FNP 05/19/24 2059

## 2024-05-19 NOTE — Discharge Instructions (Addendum)
 Viral upper respiratory infection with fever, cough and sore throat: Rapid strep is negative.  Rapid COVID was negative.  Throat culture sent.  Based on exam findings will treat with amoxicillin, 875 mg, 1 pill twice daily for 7 days.  Will adjust the plan of care, if needed once the culture results.  If the culture is negative we will call her and let her know to stop the amoxicillin.  Promethazine DM, 5 mL, every 6 hours if needed for cough.  Get plenty of fluids and rest.    Patient could have COVID and it might be too early to test.  Encouraged to consider testing for COVID again tomorrow afternoon.  Work excuse provided.  Follow-up if symptoms do not improve, worsen or new symptoms occur.

## 2024-05-22 LAB — CULTURE, GROUP A STREP (THRC)

## 2024-05-23 ENCOUNTER — Ambulatory Visit (HOSPITAL_BASED_OUTPATIENT_CLINIC_OR_DEPARTMENT_OTHER): Payer: Self-pay | Admitting: Family Medicine

## 2024-05-23 NOTE — Progress Notes (Signed)
 Throat culture is negative.  No strep throat.  Patient was put on cefdinir 300 mg twice daily for 7 days for tonsillitis.  I updated her on the results and advised her that if she was feeling okay she could just stop the antibiotic because the culture was negative.  If she wanted to she could complete the antibiotic and it should not give her any problems.  I provided her with our phone number and encouraged her to call if she had any questions.  All of that was communicated via voicemail message because I was unable to reach the patient directly.
